# Patient Record
Sex: Female | Born: 2000 | Race: White | Hispanic: No | Marital: Single | Smoking: Never smoker
Health system: Southern US, Community
[De-identification: ages and names within clinical notes are randomized; demographics above are authoritative.]

## PROBLEM LIST (undated history)

## (undated) HISTORY — PX: TYMPANOSTOMY TUBE PLACEMENT: SHX32

---
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
## 2020-11-16 ENCOUNTER — Other Ambulatory Visit: Payer: Self-pay

## 2020-11-16 ENCOUNTER — Encounter: Payer: Self-pay | Admitting: Emergency Medicine

## 2020-11-16 ENCOUNTER — Ambulatory Visit
Admission: EM | Admit: 2020-11-16 | Discharge: 2020-11-16 | Disposition: A | Discharge status: Home / Self Care | Payer: Commercial Managed Care - PPO | Attending: Physician Assistant | Admitting: Physician Assistant

## 2020-11-16 DIAGNOSIS — Z634 Disappearance and death of family member: Secondary | ICD-10-CM | POA: Insufficient documentation

## 2020-11-16 DIAGNOSIS — F4321 Adjustment disorder with depressed mood: Secondary | ICD-10-CM | POA: Insufficient documentation

## 2020-11-16 DIAGNOSIS — R059 Cough, unspecified: Secondary | ICD-10-CM

## 2020-11-16 DIAGNOSIS — R051 Acute cough: Secondary | ICD-10-CM | POA: Insufficient documentation

## 2020-11-16 DIAGNOSIS — Z20822 Contact with and (suspected) exposure to covid-19: Secondary | ICD-10-CM | POA: Diagnosis not present

## 2020-11-16 DIAGNOSIS — R112 Nausea with vomiting, unspecified: Secondary | ICD-10-CM | POA: Diagnosis not present

## 2020-11-16 DIAGNOSIS — N3001 Acute cystitis with hematuria: Secondary | ICD-10-CM | POA: Insufficient documentation

## 2020-11-16 DIAGNOSIS — R630 Anorexia: Secondary | ICD-10-CM | POA: Diagnosis not present

## 2020-11-16 DIAGNOSIS — Z68.41 Body mass index (BMI) pediatric, 5th percentile to less than 85th percentile for age: Secondary | ICD-10-CM | POA: Diagnosis not present

## 2020-11-16 DIAGNOSIS — R0982 Postnasal drip: Secondary | ICD-10-CM | POA: Insufficient documentation

## 2020-11-16 DIAGNOSIS — J069 Acute upper respiratory infection, unspecified: Secondary | ICD-10-CM | POA: Diagnosis not present

## 2020-11-16 LAB — URINALYSIS, COMPLETE (UACMP) WITH MICROSCOPIC
Bilirubin Urine: NEGATIVE
Glucose, UA: NEGATIVE mg/dL
Ketones, ur: NEGATIVE mg/dL
Nitrite: NEGATIVE
Protein, ur: 30 mg/dL — AB
Specific Gravity, Urine: 1.025 (ref 1.005–1.030)
pH: 7 (ref 5.0–8.0)

## 2020-11-16 LAB — RESP PANEL BY RT-PCR (FLU A&B, COVID) ARPGX2
Influenza A by PCR: NEGATIVE
Influenza B by PCR: NEGATIVE
SARS Coronavirus 2 by RT PCR: NEGATIVE

## 2020-11-16 LAB — PREGNANCY, URINE: Preg Test, Ur: NEGATIVE

## 2020-11-16 MED ORDER — PROMETHAZINE HCL 25 MG/ML IJ SOLN: 25.0000 mg | Freq: Once | INTRAMUSCULAR | Status: DC

## 2020-11-16 MED ORDER — ONDANSETRON 4 MG PO TBDP: 4.0000 mg | ORAL_TABLET | Freq: Three times a day (TID) | ORAL | 0 refills | Status: AC | PRN

## 2020-11-16 MED ORDER — PROMETHAZINE-DM 6.25-15 MG/5ML PO SYRP: 5.0000 mL | ORAL_SOLUTION | Freq: Every evening | ORAL | 0 refills | Status: AC | PRN

## 2020-11-16 MED ORDER — SULFAMETHOXAZOLE-TRIMETHOPRIM 800-160 MG PO TABS: 1.0000 | ORAL_TABLET | Freq: Two times a day (BID) | ORAL | 0 refills | Status: AC

## 2020-11-16 NOTE — ED Provider Notes (Signed)
[facility]    CSN: [id number] Arrival date & time: 11/16/20  1613      History   Chief Complaint Chief Complaint  Patient presents with   Cough    HPI Claire Murray is a 19 y.o. female presenting for 1 week history of cough, runny nose/nasal congestion, and post nasal drainage.  Patient states that she was recently [place] in [place] for the death of her brother-in-law.  She states that he passed away about 2 weeks ago.  She states that after he passed away she started to have nausea/vomiting and complete loss of appetite.  She states that anytime she would go to eat something she would vomit.  Patient states that that has only improved over the last 24 hours.  Patient states that the postnasal drainage is increasing her nausea and urge to vomit.  She states she is only vomited once in the past 24 hours.  She has been able to hold down food.  She denies any associate abdominal pain or diarrhea.  She says she did have a couple of episodes of dysuria.  Denies any frequency urgency.  Last menstrual period was about 2 weeks ago.  Patient is sexually active and does use protection with her spouse.  Denies any known COVID-19 exposure, but states she was at a funeral around a lot of people before symptoms started.  She has been taking over-the-counter Mucinex and NyQuil and states that it has helped symptoms a little bit.  She is otherwise healthy without any chronic medical conditions.  No other complaints or concerns.  HPI  History reviewed. No pertinent past medical history.  There are no problems to display for this patient.   Past Surgical History:  Procedure Laterality Date   TYMPANOSTOMY TUBE PLACEMENT      OB History   No obstetric history on file.      Home Medications    Prior to Admission medications   Medication Sig Start Date End Date Taking? Authorizing Provider  ondansetron (ZOFRAN ODT) 4 MG disintegrating tablet Take 1 tablet (4 mg total) by mouth  every 8 (eight) hours as needed for up to 7 days for nausea or vomiting. 11/16/20 11/23/20  Shirlee LatchEaves, Katryna Tschirhart B, PA-C  promethazine-dextromethorphan (PROMETHAZINE-DM) 6.25-15 MG/5ML syrup Take 5 mLs by mouth at bedtime as needed for up to 10 days for cough. 11/16/20 11/26/20  Shirlee LatchEaves, Shaylinn Hladik B, PA-C    Family History Family History  Problem Relation Age of Onset   Healthy Mother    Healthy Father     Social History Social History   Tobacco Use   Smoking status: Never Smoker   Smokeless tobacco: Never Used  Building services engineerVaping Use   Vaping Use: Never used  Substance Use Topics   Alcohol use: Not Currently   Drug use: Not Currently     Allergies   Patient has no known allergies.   Review of Systems Review of Systems  Constitutional: Positive for appetite change and fatigue. Negative for chills, diaphoresis and fever.  HENT: Positive for congestion, postnasal drip and rhinorrhea. Negative for ear pain, sinus pressure, sinus pain and sore throat.   Respiratory: Positive for cough. Negative for shortness of breath.   Cardiovascular: Negative for chest pain.  Gastrointestinal: Positive for nausea and vomiting. Negative for abdominal pain.  Genitourinary: Positive for dysuria (3 times). Negative for frequency and urgency.  Musculoskeletal: Negative for arthralgias and myalgias.  Skin: Negative for rash.  Neurological: Negative for weakness and headaches.  Hematological: Negative for adenopathy.  Psychiatric/Behavioral: Negative for sleep disturbance.     Physical Exam Triage Vital Signs ED Triage Vitals  Enc Vitals Group     BP 11/16/20 1700 (!) 132/91     Pulse Rate 11/16/20 1700 84     Resp 11/16/20 1700 18     Temp 11/16/20 1700 98.1 F (36.7 C)     Temp Source 11/16/20 1700 Oral     SpO2 11/16/20 1700 100 %     Weight 11/16/20 1657 153 lb (69.4 kg)     Height 11/16/20 1657 5\' 5"  (1.651 m)     Head Circumference --      Peak Flow --      Pain Score 11/16/20 1657 0      Pain Loc --      Pain Edu? --      Excl. in GC? --    No data found.  Updated Vital Signs BP (!) 132/91 (BP Location: Left Arm)    Pulse 84    Temp 98.1 F (36.7 C) (Oral)    Resp 18    Ht 5\' 5"  (1.651 m)    Wt 153 lb (69.4 kg)    LMP 11/03/2020    SpO2 100%    BMI 25.46 kg/m    Physical Exam Vitals and nursing note reviewed.  Constitutional:      General: She is not in acute distress.    Appearance: Normal appearance. She is not ill-appearing or toxic-appearing.  HENT:     Head: Normocephalic and atraumatic.     Nose: Rhinorrhea (trace clear drainage) present.     Mouth/Throat:     Mouth: Mucous membranes are moist.     Pharynx: Oropharynx is clear. Posterior oropharyngeal erythema (mild with moderate clear PND) present.  Eyes:     General: No scleral icterus.       Right eye: No discharge.        Left eye: No discharge.     Conjunctiva/sclera: Conjunctivae normal.  Cardiovascular:     Rate and Rhythm: Normal rate and regular rhythm.     Heart sounds: Normal heart sounds.  Pulmonary:     Effort: Pulmonary effort is normal. No respiratory distress.     Breath sounds: Normal breath sounds. No wheezing, rhonchi or rales.  Abdominal:     Palpations: Abdomen is soft.     Tenderness: There is no abdominal tenderness. There is no right CVA tenderness, left CVA tenderness or guarding.  Musculoskeletal:     Cervical back: Neck supple.  Skin:    General: Skin is dry.  Neurological:     General: No focal deficit present.     Mental Status: She is alert. Mental status is at baseline.     Motor: No weakness.     Gait: Gait normal.  Psychiatric:        Mood and Affect: Mood normal.        Behavior: Behavior normal.        Thought Content: Thought content normal.      UC Treatments / Results  Labs (all labs ordered are listed, but only abnormal results are displayed) Labs Reviewed  URINE CULTURE - Abnormal; Notable for the following components:      Result Value    Culture >=100,000 COLONIES/mL STAPHYLOCOCCUS SAPROPHYTICUS (*)    Organism ID, Bacteria STAPHYLOCOCCUS SAPROPHYTICUS (*)    All other components within normal limits  URINALYSIS, COMPLETE (UACMP) WITH MICROSCOPIC - Abnormal; Notable for  the following components:   APPearance CLOUDY (*)    Hgb urine dipstick TRACE (*)    Protein, ur 30 (*)    Leukocytes,Ua SMALL (*)    Bacteria, UA FEW (*)    All other components within normal limits  RESP PANEL BY RT-PCR (FLU A&B, COVID) [id number]  PREGNANCY, URINE    EKG   Radiology No results found.  Procedures Procedures (including critical care time)  Medications Ordered in UC Medications - No data to display  Initial Impression / Assessment and Plan / UC Course  I have reviewed the triage vital signs and the nursing notes.  Pertinent labs & imaging results that were available during my care of the patient were reviewed by me and considered in my medical decision making (see chart for details).    19 year old female presenting for a 1 week history of cough, runny nose, postnasal drip and feeling feverish.  She also states she has had a reduced appetite and nausea for couple weeks.  Admits to about 3 episodes of intermittent dysuria.  All vital signs are normal and stable patient is afebrile and in no acute distress.  Respiratory panel all negative.  Pregnancy test negative.  Urinalysis does show trace blood, small leukocytes, and protein.  We will send urine for culture and treat for suspected UTI since she did have symptoms at one point.  Advised increasing rest and fluids.  Advised she likely has a viral common cold as well as a mild UTI.  I have sent Promethazine DM for her to take in the evenings.  Advised her to take Zofran as needed during the day for nausea.  Bactrim sent for UTI.  Advised to follow-up with PCP if she still having issues with her appetite for the next couple weeks or if she develops any abdominal pain or vomiting again.   Advised this could be part of the grief reaction, but if it persists she will need work-up for other conditions.  ED precautions thoroughly reviewed with patient.   Final Clinical Impressions(s) / UC Diagnoses   Final diagnoses:  Acute upper respiratory infection  Cough  Post-nasal drainage  Non-intractable vomiting with nausea, unspecified vomiting type  Grief reaction  Acute cystitis with hematuria     Discharge Instructions     Your flu and Covid tests are negative.  The urinalysis shows that you might have a UTI.  I have sent Bactrim DS for this.  We will send urine for culture and call you with the results if your treatment needs to be changed.  It is possible that the culture might not grow any bacteria and you may be advised to stop the antibiotic.  Increase your rest and fluids.  Take Zofran as needed during the day for nausea and vomiting.  Take the Promethazine DM cough syrup at bedtime as needed.  Continue to lean on your family and friends for support.  Follow-up with PCP as needed.    ED Prescriptions    Medication Sig Dispense Auth. Provider   promethazine-dextromethorphan (PROMETHAZINE-DM) 6.25-15 MG/5ML syrup Take 5 mLs by mouth at bedtime as needed for up to 10 days for cough. 50 mL Eusebio Friendly B, PA-C   ondansetron (ZOFRAN ODT) 4 MG disintegrating tablet Take 1 tablet (4 mg total) by mouth every 8 (eight) hours as needed for up to 7 days for nausea or vomiting. 20 tablet Eusebio Friendly B, PA-C   sulfamethoxazole-trimethoprim (BACTRIM DS) 800-160 MG tablet Take 1 tablet by mouth 2 (  two) times daily for 3 days. 6 tablet Gareth Morgan     PDMP not reviewed this encounter.   Shirlee Latch, PA-C 11/20/20 [phone]

## 2020-11-16 NOTE — Discharge Instructions (Signed)
Your flu and Covid tests are negative.  The urinalysis shows that you might have a UTI.  I have sent Bactrim DS for this.  We will send urine for culture and call you with the results if your treatment needs to be changed.  It is possible that the culture might not grow any bacteria and you may be advised to stop the antibiotic.  Increase your rest and fluids.  Take Zofran as needed during the day for nausea and vomiting.  Take the Promethazine DM cough syrup at bedtime as needed.  Continue to lean on your family and friends for support.  Follow-up with PCP as needed.

## 2020-11-16 NOTE — ED Triage Notes (Signed)
Pt c/o cough, runny nose and post nasal drip, fever. Started about a week ago. She states she has not had an appetite either but feels that may be related to stress and a death in the family.

## 2020-11-19 LAB — URINE CULTURE: Culture: >=100000 — AB

## 2021-02-24 ENCOUNTER — Other Ambulatory Visit: Payer: Self-pay

## 2021-02-24 DIAGNOSIS — N390 Urinary tract infection, site not specified: Secondary | ICD-10-CM | POA: Diagnosis not present

## 2021-02-24 DIAGNOSIS — R1030 Lower abdominal pain, unspecified: Secondary | ICD-10-CM | POA: Diagnosis present

## 2021-02-24 DIAGNOSIS — R112 Nausea with vomiting, unspecified: Secondary | ICD-10-CM | POA: Insufficient documentation

## 2021-02-24 LAB — CBC
HCT: 41.3 % (ref 36.0–46.0)
Hemoglobin: 14.3 g/dL (ref 12.0–15.0)
MCH: 29.9 pg (ref 26.0–34.0)
MCHC: 34.6 g/dL (ref 30.0–36.0)
MCV: 86.4 fL (ref 80.0–100.0)
Platelets: 284 10*3/uL (ref 150–400)
RBC: 4.78 MIL/uL (ref 3.87–5.11)
RDW: 12.2 % (ref 11.5–15.5)
WBC: 16.5 10*3/uL — ABNORMAL HIGH (ref 4.0–10.5)
nRBC: 0 % (ref 0.0–0.2)

## 2021-02-24 LAB — URINALYSIS, COMPLETE (UACMP) WITH MICROSCOPIC
Bacteria, UA: NONE SEEN
Bilirubin Urine: NEGATIVE
Glucose, UA: NEGATIVE mg/dL
Ketones, ur: 80 mg/dL — AB
Nitrite: NEGATIVE
Protein, ur: 30 mg/dL — AB
Specific Gravity, Urine: 1.029 (ref 1.005–1.030)
pH: 5 (ref 5.0–8.0)

## 2021-02-24 LAB — POC URINE PREG, ED: Preg Test, Ur: NEGATIVE

## 2021-02-24 MED ORDER — ONDANSETRON 4 MG PO TBDP: 4.0000 mg | ORAL_TABLET | Freq: Once | ORAL | Status: DC | PRN

## 2021-02-24 NOTE — ED Notes (Signed)
Patient now reporting chest pain and SOB

## 2021-02-24 NOTE — ED Triage Notes (Signed)
Generalized abdominal pain with n/v. Onset 1700 today. Denies fever at home or known sick contact. Denies diarrhea. Reports increased urinary frequency with decreased output and painful urination with voiding. Urinary symptoms onset 1800 today.

## 2021-02-25 ENCOUNTER — Emergency Department
Admission: EM | Admit: 2021-02-25 | Discharge: 2021-02-25 | Disposition: A | Discharge status: Home / Self Care | Payer: Commercial Managed Care - PPO | Attending: Emergency Medicine | Admitting: Emergency Medicine

## 2021-02-25 ENCOUNTER — Emergency Department: Payer: Commercial Managed Care - PPO

## 2021-02-25 DIAGNOSIS — R112 Nausea with vomiting, unspecified: Secondary | ICD-10-CM

## 2021-02-25 DIAGNOSIS — R1030 Lower abdominal pain, unspecified: Secondary | ICD-10-CM

## 2021-02-25 DIAGNOSIS — N390 Urinary tract infection, site not specified: Secondary | ICD-10-CM

## 2021-02-25 LAB — COMPREHENSIVE METABOLIC PANEL
ALT: 70 U/L — ABNORMAL HIGH (ref 0–44)
AST: 35 U/L (ref 15–41)
Albumin: 4.7 g/dL (ref 3.5–5.0)
Alkaline Phosphatase: 46 U/L (ref 38–126)
Anion gap: 13 (ref 5–15)
BUN: 14 mg/dL (ref 6–20)
CO2: 16 mmol/L — ABNORMAL LOW (ref 22–32)
Calcium: 9.7 mg/dL (ref 8.9–10.3)
Chloride: 107 mmol/L (ref 98–111)
Creatinine, Ser: 0.76 mg/dL (ref 0.44–1.00)
GFR, Estimated: >60 mL/min (ref >60)
Glucose, Bld: 116 mg/dL — ABNORMAL HIGH (ref 70–99)
Potassium: 4 mmol/L (ref 3.5–5.1)
Sodium: 136 mmol/L (ref 135–145)
Total Bilirubin: 2 mg/dL — ABNORMAL HIGH (ref 0.3–1.2)
Total Protein: 8 g/dL (ref 6.5–8.1)

## 2021-02-25 LAB — LIPASE, BLOOD: Lipase: 25 U/L (ref 11–51)

## 2021-02-25 MED ORDER — SODIUM CHLORIDE 0.9 % IV SOLN
1.0000 g | INTRAVENOUS | Status: AC
  Administered 2021-02-25: 1 g via INTRAVENOUS
  Filled 2021-02-25: qty 10

## 2021-02-25 MED ORDER — CEPHALEXIN 250 MG PO CAPS: 250.0000 mg | ORAL_CAPSULE | Freq: Four times a day (QID) | ORAL | 0 refills | Status: AC

## 2021-02-25 MED ORDER — ONDANSETRON 4 MG PO TBDP: ORAL_TABLET | ORAL | 0 refills | Status: DC

## 2021-02-25 MED ORDER — LACTATED RINGERS IV BOLUS
1000.0000 mL | Freq: Once | INTRAVENOUS | Status: AC
  Administered 2021-02-25: 1000 mL via INTRAVENOUS

## 2021-02-25 MED ORDER — ONDANSETRON HCL 4 MG/2ML IJ SOLN
4.0000 mg | INTRAMUSCULAR | Status: AC
  Administered 2021-02-25: 4 mg via INTRAVENOUS
  Filled 2021-02-25: qty 2

## 2021-02-25 MED ORDER — IOHEXOL 300 MG/ML  SOLN
100.0000 mL | Freq: Once | INTRAMUSCULAR | Status: AC | PRN
  Administered 2021-02-25: 100 mL via INTRAVENOUS

## 2021-02-25 NOTE — ED Notes (Signed)
E signature pad not working. Pt educated on discharge instructions and verbalized understanding.  

## 2021-02-25 NOTE — ED Notes (Signed)
Pt states she threw up after drinking some water, but states she feels better and nausea has improved.

## 2021-02-25 NOTE — ED Notes (Signed)
Pt able to drink 12oz of water and hold it down. Dr York Cerise notified.

## 2021-02-25 NOTE — ED Provider Notes (Signed)
[facility] Emergency Department Provider Note  ____________________________________________   Event Date/Time   First MD Initiated Contact with Patient 02/25/21 0021     (approximate)  I have reviewed the triage vital signs and the nursing notes.   HISTORY  Chief Complaint Abdominal Pain    HPI Claire Murray is a 20 y.o. female with no chronic medical issues who presents for evaluation of severe vomiting and lower abdominal pain.  She reports that she started out this morning feeling okay but then around 5 PM she developed nausea and vomiting.  Shortly thereafter she also developed some aching abdominal pain that is essentially constant and her lower abdomen around her bellybutton and just to the left side.  She has also had at least 1 if not 2 days of increased urinary frequency and burning when she urinates.  She has a history of frequent urinary tract infections.  She still has her appendix.  She denies fever, sore throat, chest pain, shortness of breath.  Nothing in particular makes his symptoms better or worse and she is actively vomiting during our interview.  The symptoms are severe.   Her last menstrual period was last week and it was normal.        History reviewed. No pertinent past medical history.  There are no problems to display for this patient.   Past Surgical History:  Procedure Laterality Date  . TYMPANOSTOMY TUBE PLACEMENT      Prior to Admission medications   Medication Sig Start Date End Date Taking? Authorizing Provider  cephALEXin (KEFLEX) 250 MG capsule Take 1 capsule (250 mg total) by mouth 4 (four) times daily for 5 days. 02/25/21 03/02/21 Yes Loleta Rose, MD  ondansetron (ZOFRAN ODT) 4 MG disintegrating tablet Allow 1-2 tablets to dissolve in your mouth every 8 hours as needed for nausea/vomiting 02/25/21  Yes Loleta Rose, MD    Allergies Patient has no known allergies.  Family History  Problem Relation Age of Onset  .  Healthy Mother   . Healthy Father     Social History Social History   Tobacco Use  . Smoking status: Never Smoker  . Smokeless tobacco: Never Used  Vaping Use  . Vaping Use: Never used  Substance Use Topics  . Alcohol use: Not Currently  . Drug use: Not Currently    Review of Systems Constitutional: No fever/chills Eyes: No visual changes. ENT: No sore throat. Cardiovascular: Denies chest pain. Respiratory: Denies shortness of breath. Gastrointestinal: Lower periumbilical abdominal pain slightly worse on the left.  Severe nausea and vomiting for at least 7 hours. Genitourinary: Positive for dysuria and polyuria. Musculoskeletal: Negative for neck pain.  Negative for back pain. Integumentary: Negative for rash. Neurological: Negative for headaches, focal weakness or numbness.   ____________________________________________   PHYSICAL EXAM:  VITAL SIGNS: ED Triage Vitals  Enc Vitals Group     BP 02/24/21 2325 117/73     Pulse Rate 02/24/21 2325 76     Resp 02/24/21 2325 18     Temp 02/24/21 2325 97.8 F (36.6 C)     Temp Source 02/24/21 2325 Oral     SpO2 02/24/21 2325 100 %     Weight 02/24/21 2326 61.2 kg (135 lb)     Height 02/24/21 2326 1.651 m (5\' 5" )     Head Circumference --      Peak Flow --      Pain Score 02/24/21 2326 5     Pain Loc --  Pain Edu? --      Excl. in GC? --     Constitutional: Alert and oriented.  Appears very uncomfortable from nausea. Eyes: Conjunctivae are normal.  Head: Atraumatic. Nose: No congestion/rhinnorhea. Mouth/Throat: Patient is wearing a mask. Neck: No stridor.  No meningeal signs.   Cardiovascular: Normal rate, regular rhythm. Good peripheral circulation. Respiratory: Normal respiratory effort.  No retractions. Gastrointestinal: Soft and nondistended.  Tenderness to palpation around the umbilicus.  No specific focal tenderness but there is some guarding. Musculoskeletal: No lower extremity tenderness nor edema. No  gross deformities of extremities. Neurologic:  Normal speech and language. No gross focal neurologic deficits are appreciated.  Skin:  Skin is warm, dry and intact. Psychiatric: Mood and affect are normal. Speech and behavior are normal.  ____________________________________________   LABS (all labs ordered are listed, but only abnormal results are displayed)  Labs Reviewed  COMPREHENSIVE METABOLIC PANEL - Abnormal; Notable for the following components:      Result Value   CO2 16 (*)    Glucose, Bld 116 (*)    ALT 70 (*)    Total Bilirubin 2.0 (*)    All other components within normal limits  CBC - Abnormal; Notable for the following components:   WBC 16.5 (*)    All other components within normal limits  URINALYSIS, COMPLETE (UACMP) WITH MICROSCOPIC - Abnormal; Notable for the following components:   Color, Urine YELLOW (*)    APPearance HAZY (*)    Hgb urine dipstick SMALL (*)    Ketones, ur 80 (*)    Protein, ur 30 (*)    Leukocytes,Ua MODERATE (*)    All other components within normal limits  URINE CULTURE  LIPASE, BLOOD  POC URINE PREG, ED   ____________________________________________  EKG  None - EKG not ordered by ED physician ____________________________________________  RADIOLOGY Marylou MccoyI, Cory Forbach, personally viewed and evaluated these images (plain radiographs) as part of my medical decision making, as well as reviewing the written report by the radiologist.  ED MD interpretation: Normal CT abdomen/pelvis with no evidence of acute abnormality.  Official radiology report(s): CT ABDOMEN PELVIS W CONTRAST  Result Date: 02/25/2021 CLINICAL DATA:  Nausea and vomiting with right-sided abdominal pain EXAM: CT ABDOMEN AND PELVIS WITH CONTRAST TECHNIQUE: Multidetector CT imaging of the abdomen and pelvis was performed using the standard protocol following bolus administration of intravenous contrast. CONTRAST:  100mL OMNIPAQUE IOHEXOL 300 MG/ML  SOLN COMPARISON:  None.  FINDINGS: Lower chest: No acute abnormality. Hepatobiliary: No focal liver abnormality is seen. No gallstones, gallbladder wall thickening, or biliary dilatation. Pancreas: Unremarkable. No pancreatic ductal dilatation or surrounding inflammatory changes. Spleen: Normal in size without focal abnormality. Adrenals/Urinary Tract: Adrenal glands are within normal limits. Kidneys demonstrate a normal enhancement pattern. No renal calculi or obstructive changes are seen. Stomach/Bowel: No obstructive or inflammatory changes of the colon are noted. The appendix is well visualized and within normal limits. Small bowel and stomach are unremarkable. Vascular/Lymphatic: No significant vascular findings are present. No enlarged abdominal or pelvic lymph nodes. Reproductive: Uterus and bilateral adnexa are unremarkable. Other: No abdominal wall hernia or abnormality. No abdominopelvic ascites. Musculoskeletal: No acute or significant osseous findings. IMPRESSION: Normal-appearing appendix. No acute abnormality noted to correspond with the patient's given clinical symptomatology. Electronically Signed   By: Alcide CleverMark  Lukens M.D.   On: 02/25/2021 01:31    ____________________________________________   PROCEDURES   Procedure(s) performed (including Critical Care):  Procedures   ____________________________________________   INITIAL IMPRESSION / MDM /  ASSESSMENT AND PLAN / ED COURSE  As part of my medical decision making, I reviewed the following data within the electronic MEDICAL RECORD NUMBER Nursing notes reviewed and incorporated, Labs reviewed , Old chart reviewed and Notes from prior ED visits   Differential diagnosis includes, but is not limited to, UTI/pyelonephritis, obstructive stone, gastritis, foodborne pathogen, appendicitis, diverticulitis, mesenteric adenitis, less likely adnexal or uterine issue.  Vital signs are stable and within normal limits with the patient is extremely comfortable due to the  nausea and vomiting.  She said that it is much worse than the pain itself.  However she was guarding when I palpated around her umbilicus.  She has a leukocytosis of 16.5.  Comprehensive metabolic panel is essentially normal other than a very slight elevation of ALT and total bilirubin which I suspect is related to her persistent vomiting.  Urine pregnancy test is negative.  Urinalysis is notable for ketones and moderate leukocytes.  She could be suffering from UTI/pyelonephritis, but this could also be some pyuria in the setting of appendicitis.  Given her tenderness to palpation and constellation of symptoms I will proceed with CT of the abdomen and pelvis with IV contrast.  She is currently not tolerating oral intake.  I also ordered Zofran 4 mg IV and lactated Ringer's 1 L.  If she thinks she needs pain medicine she will let me know.     Clinical Course as of 02/25/21 0175  Wynelle Link Feb 25, 2021  0211 Normal CT abdomen pelvis.  I am treating with ceftriaxone 1 g IV for UTI.  No evidence of pyelonephritis on CT. [CF]  0220 Patient says she feels better.  She understands the results and agrees with the plan for discharge if feeling better and tolerating oral intake. [CF]  0330 Patient passed p.o. challenge and wants to go home.  Prescriptions as listed below.  I gave my usual and customary return precautions and follow-up recommendations. [CF]    Clinical Course User Index [CF] Loleta Rose, MD     ____________________________________________  FINAL CLINICAL IMPRESSION(S) / ED DIAGNOSES  Final diagnoses:  Urinary tract infection without hematuria, site unspecified  Lower abdominal pain  Non-intractable vomiting with nausea, unspecified vomiting type     MEDICATIONS GIVEN DURING THIS VISIT:  Medications  ondansetron (ZOFRAN-ODT) disintegrating tablet 4 mg (has no administration in time range)  ondansetron (ZOFRAN) injection 4 mg (4 mg Intravenous Given 02/25/21 0047)  lactated ringers  bolus 1,000 mL (0 mLs Intravenous Stopped 02/25/21 0233)  iohexol (OMNIPAQUE) 300 MG/ML solution 100 mL (100 mLs Intravenous Contrast Given 02/25/21 0107)  cefTRIAXone (ROCEPHIN) 1 g in sodium chloride 0.9 % 100 mL IVPB (0 g Intravenous Stopped 02/25/21 0233)     ED Discharge Orders         Ordered    cephALEXin (KEFLEX) 250 MG capsule  4 times daily        02/25/21 0328    ondansetron (ZOFRAN ODT) 4 MG disintegrating tablet        02/25/21 1025          *Please note:  Kishana Battey was evaluated in Emergency Department on 02/25/2021 for the symptoms described in the history of present illness. She was evaluated in the context of the global COVID-19 pandemic, which necessitated consideration that the patient might be at risk for infection with the SARS-CoV-2 virus that causes COVID-19. Institutional protocols and algorithms that pertain to the evaluation of patients at risk for COVID-19 are in a state of  rapid change based on information released by regulatory bodies including the CDC and federal and state organizations. These policies and algorithms were followed during the patient's care in the ED.  Some ED evaluations and interventions may be delayed as a result of limited staffing during and after the pandemic.*  Note:  This document was prepared using Dragon voice recognition software and may include unintentional dictation errors.   Loleta Rose, MD 02/25/21 [phone]

## 2021-02-25 NOTE — ED Notes (Signed)
Pt assisted to toilet 

## 2021-02-25 NOTE — Discharge Instructions (Signed)

## 2021-02-26 LAB — URINE CULTURE: Special Requests: NORMAL

## 2021-04-27 ENCOUNTER — Ambulatory Visit: Payer: Self-pay

## 2021-04-28 ENCOUNTER — Encounter: Payer: Self-pay | Admitting: Emergency Medicine

## 2021-04-28 ENCOUNTER — Other Ambulatory Visit: Payer: Self-pay

## 2021-04-28 ENCOUNTER — Ambulatory Visit
Admission: EM | Admit: 2021-04-28 | Discharge: 2021-04-28 | Disposition: A | Discharge status: Home / Self Care | Payer: Commercial Managed Care - PPO | Attending: Sports Medicine | Admitting: Sports Medicine

## 2021-04-28 DIAGNOSIS — R1111 Vomiting without nausea: Secondary | ICD-10-CM | POA: Diagnosis not present

## 2021-04-28 MED ORDER — ONDANSETRON HCL 4 MG PO TABS: 4.0000 mg | ORAL_TABLET | Freq: Four times a day (QID) | ORAL | 0 refills | Status: DC

## 2021-04-28 NOTE — ED Provider Notes (Signed)
[facility]    CSN: [id number] Arrival date & time: 04/28/21  1049      History   Chief Complaint Chief Complaint  Patient presents with  . Emesis    HPI Claire Murray is a 20 y.o. female.   Patient is a 20 year old female who presents for evaluation of the above issue.  She does not have a primary care doctor.  She works in a daycare.  She reports 3 to 4 days of emesis.  She says that it only occurs if she eats solid foods.  She is okay with fluids.  She can also take applesauce.  She has been using over-the-counter antivomiting medication with limited success.  She says several of the children have similar symptoms.  There is concern about it possibly being related to some fruit that was eaten on that day.  It may also be a GI bug.  She denies any fever shakes chills.  No diarrhea.  She has not sought any medical care.  No urinary symptoms.  No blood in the urine or stool.  She has not been working she has been at home resting try to get over this.  No chest pain or shortness of breath.  No COVID exposure or COVID concerns.  No red flag signs or symptoms elicited on history.     History reviewed. No pertinent past medical history.  There are no problems to display for this patient.   Past Surgical History:  Procedure Laterality Date  . TYMPANOSTOMY TUBE PLACEMENT      OB History   No obstetric history on file.      Home Medications    Prior to Admission medications   Medication Sig Start Date End Date Taking? Authorizing Provider  ondansetron (ZOFRAN) 4 MG tablet Take 1 tablet (4 mg total) by mouth every 6 (six) hours. 04/28/21  Yes Delton See, MD    Family History Family History  Problem Relation Age of Onset  . Healthy Mother   . Healthy Father     Social History Social History   Tobacco Use  . Smoking status: Never Smoker  . Smokeless tobacco: Never Used  Vaping Use  . Vaping Use: Never used  Substance Use Topics  . Alcohol use: Not  Currently  . Drug use: Not Currently     Allergies   Patient has no known allergies.   Review of Systems Review of Systems  Constitutional: Positive for activity change and appetite change. Negative for chills, diaphoresis, fatigue and fever.  HENT: Negative for congestion, ear pain, postnasal drip, rhinorrhea, sinus pressure, sinus pain, sneezing and sore throat.   Eyes: Negative for pain.  Respiratory: Negative for cough, chest tightness and shortness of breath.   Cardiovascular: Negative for chest pain and palpitations.  Gastrointestinal: Positive for vomiting. Negative for abdominal pain, blood in stool, diarrhea and nausea.  Genitourinary: Negative for dysuria, flank pain, pelvic pain, vaginal bleeding, vaginal discharge and vaginal pain.  Musculoskeletal: Negative for back pain, myalgias and neck pain.  Skin: Negative for color change, pallor, rash and wound.  Neurological: Negative for dizziness, light-headedness and headaches.  All other systems reviewed and are negative.    Physical Exam Triage Vital Signs ED Triage Vitals  Enc Vitals Group     BP 04/28/21 1101 119/86     Pulse Rate 04/28/21 1101 72     Resp 04/28/21 1101 14     Temp 04/28/21 1101 98.2 F (36.8 C)     Temp  Source 04/28/21 1101 Oral     SpO2 04/28/21 1101 98 %     Weight 04/28/21 1100 122 lb (55.3 kg)     Height 04/28/21 1100 5\' 5"  (1.651 m)     Head Circumference --      Peak Flow --      Pain Score 04/28/21 1100 0     Pain Loc --      Pain Edu? --      Excl. in GC? --    No data found.  Updated Vital Signs BP 119/86 (BP Location: Left Arm)   Pulse 72   Temp 98.2 F (36.8 C) (Oral)   Resp 14   Ht 5\' 5"  (1.651 m)   Wt 55.3 kg   LMP 03/30/2021 (Approximate)   SpO2 98%   BMI 20.30 kg/m   Visual Acuity Right Eye Distance:   Left Eye Distance:   Bilateral Distance:    Right Eye Near:   Left Eye Near:    Bilateral Near:     Physical Exam Vitals and nursing note reviewed.   Constitutional:      General: She is not in acute distress.    Appearance: Normal appearance. She is not ill-appearing, toxic-appearing or diaphoretic.  HENT:     Head: Normocephalic and atraumatic.     Nose: Nose normal.     Mouth/Throat:     Mouth: Mucous membranes are moist.     Pharynx: No oropharyngeal exudate or posterior oropharyngeal erythema.  Eyes:     General: No scleral icterus.       Right eye: No discharge.        Left eye: No discharge.     Conjunctiva/sclera: Conjunctivae normal.     Pupils: Pupils are equal, round, and reactive to light.  Cardiovascular:     Rate and Rhythm: Normal rate and regular rhythm.     Pulses: Normal pulses.     Heart sounds: Normal heart sounds. No murmur heard. No friction rub. No gallop.   Pulmonary:     Effort: Pulmonary effort is normal.     Breath sounds: Normal breath sounds. No stridor. No wheezing, rhonchi or rales.  Abdominal:     General: Abdomen is flat. There is no distension.     Tenderness: There is no abdominal tenderness. There is no right CVA tenderness, left CVA tenderness, guarding or rebound.  Musculoskeletal:     Cervical back: Normal range of motion and neck supple. No rigidity.  Skin:    General: Skin is warm and dry.     Capillary Refill: Capillary refill takes less than 2 seconds.     Findings: No bruising, erythema, lesion or rash.  Neurological:     General: No focal deficit present.     Mental Status: She is alert and oriented to person, place, and time.  Psychiatric:        Mood and Affect: Mood normal.      UC Treatments / Results  Labs (all labs ordered are listed, but only abnormal results are displayed) Labs Reviewed - No data to display  EKG   Radiology No results found.  Procedures Procedures (including critical care time)  Medications Ordered in UC Medications - No data to display  Initial Impression / Assessment and Plan / UC Course  I have reviewed the triage vital signs and  the nursing notes.  Pertinent labs & imaging results that were available during my care of the patient were reviewed by me and  considered in my medical decision making (see chart for details).   Clinical impression: Vomiting without nausea for 3 to 4 days.  Vital signs and examination are reassuring.  No evidence of dehydration.  No acute abdomen.  Treatment plan: 1.  The findings and treatment plan were discussed in detail with the patient.  Patient was in agreement. 2.  I did discuss the fact that her vitals and physical exam are reassuring.  Mucous membranes were moist.  There is no evidence of dehydration. 3.  Educational handouts provided on vomiting and a bland diet. 4.  I gave her prescription for Zofran she get that into her pharmacy. 5.  Provided a work note and she can return to work on her next scheduled shift on Tuesday, May 31. 6.  Indicated that if her symptoms were to worsen in any way she needed to go to the ER for higher level of care. 7.  Put in a referral for her to establish care with a local primary care provider. 8.  She was discharged in stable condition with follow-up as needed.    Final Clinical Impressions(s) / UC Diagnoses   Final diagnoses:  Non-intractable vomiting without nausea, unspecified vomiting type     Discharge Instructions     As we discussed, your vital signs and exam are very reassuring. I have provided educational handouts of vomiting and a bland diet. I also sent in a prescription for Zofran. I also provided you a work note. If your symptoms were to worsen in any way you need to go to the ER for higher level of care. Follow-up here as needed.    ED Prescriptions    Medication Sig Dispense Auth. Provider   ondansetron (ZOFRAN) 4 MG tablet Take 1 tablet (4 mg total) by mouth every 6 (six) hours. 12 tablet Delton See, MD     PDMP not reviewed this encounter.   Delton See, MD 04/28/21 1213

## 2021-04-28 NOTE — ED Triage Notes (Signed)
Patient reports vomiting that started on Wed.  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  Patient denies any pain at this time.

## 2021-04-28 NOTE — Discharge Instructions (Signed)
As we discussed, your vital signs and exam are very reassuring. I have provided educational handouts of vomiting and a bland diet. I also sent in a prescription for Zofran. I also provided you a work note. If your symptoms were to worsen in any way you need to go to the ER for higher level of care. Follow-up here as needed.

## 2021-07-16 ENCOUNTER — Encounter: Payer: Self-pay | Admitting: Emergency Medicine

## 2021-07-16 ENCOUNTER — Ambulatory Visit
Admission: EM | Admit: 2021-07-16 | Discharge: 2021-07-16 | Disposition: A | Discharge status: Home / Self Care | Payer: Commercial Managed Care - PPO | Attending: Physician Assistant | Admitting: Physician Assistant

## 2021-07-16 ENCOUNTER — Other Ambulatory Visit: Payer: Self-pay

## 2021-07-16 DIAGNOSIS — N10 Acute tubulo-interstitial nephritis: Secondary | ICD-10-CM | POA: Diagnosis present

## 2021-07-16 DIAGNOSIS — R109 Unspecified abdominal pain: Secondary | ICD-10-CM | POA: Insufficient documentation

## 2021-07-16 LAB — URINALYSIS, COMPLETE (UACMP) WITH MICROSCOPIC
Glucose, UA: NEGATIVE mg/dL
Ketones, ur: 80 mg/dL — AB
Nitrite: NEGATIVE
Protein, ur: 100 mg/dL — AB
RBC / HPF: >50 RBC/hpf (ref 0–5)
Specific Gravity, Urine: >1.03 — ABNORMAL HIGH (ref 1.005–1.030)
pH: 5.5 (ref 5.0–8.0)

## 2021-07-16 MED ORDER — CIPROFLOXACIN HCL 500 MG PO TABS: 500.0000 mg | ORAL_TABLET | Freq: Two times a day (BID) | ORAL | 0 refills | Status: AC

## 2021-07-16 NOTE — ED Triage Notes (Signed)
Pt c/o left sided flank pain. Started about 5 days ago. 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 Denies urinary symptoms. She states she vomiting 3 days ago but none since.

## 2021-07-16 NOTE — ED Provider Notes (Signed)
[facility]    CSN: [id number] Arrival date & time: 07/16/21  1032      History   Chief Complaint Chief Complaint  Patient presents with   Flank Pain    HPI Claire Murray is a 20 y.o. female.   Patient is a 20 year old female who presents with chief complaint of right sided flank pain.  Patient states her pain began Thursday and actually had right and left flank pain but states the left pain resolved yesterday.  Patient states the pain was worse on Friday and Friday night had episode of vomiting of watery emesis.  She states she tried heating pack which did not help.  She also states last night she woke up with some sweats.  Patient reports some intermittent chills but denies fever or any urinary symptoms.  States last menstrual period was about a month ago and feels like she is probably start her next one today or tomorrow.  Patient reports is normal vaginal discharge.  She does report feeling weak or lightheaded if she stands too long.  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   History reviewed. No pertinent past medical history.  There are no problems to display for this patient.   Past Surgical History:  Procedure Laterality Date   TYMPANOSTOMY TUBE PLACEMENT      OB History   No obstetric history on file.      Home Medications    Prior to Admission medications   Medication Sig Start Date End Date Taking? Authorizing Provider  ciprofloxacin (CIPRO) 500 MG tablet Take 1 tablet (500 mg total) by mouth 2 (two) times daily. 07/16/21  Yes Candis Schatz, PA-C    Family History Family History  Problem Relation Age of Onset   Healthy Mother    Healthy Father     Social History Social History   Tobacco Use   Smoking status: Never   Smokeless tobacco: Never  Vaping Use   Vaping Use: Every day  Substance Use Topics   Alcohol use: Not Currently   Drug use: Not Currently      Allergies   Patient has no known allergies.   Review of Systems Review of Systems as noted above in HPI.  Other systems reviewed and found to be negative   Physical Exam Triage Vital Signs ED Triage Vitals  Enc Vitals Group     BP 07/16/21 1049 108/71     Pulse Rate 07/16/21 1049 91     Resp 07/16/21 1049 18     Temp 07/16/21 1049 98.7 F (37.1 C)     Temp Source 07/16/21 1049 Oral     SpO2 07/16/21 1049 97 %     Weight 07/16/21 1046 121 lb 14.6 oz (55.3 kg)     Height 07/16/21 1046 5\' 5"  (1.651 m)     Head Circumference --      Peak Flow --      Pain Score 07/16/21 1046 7     Pain Loc --      Pain Edu? --      Excl. in GC? --    No data found.  Updated Vital Signs BP 108/71 (BP Location: Left Arm)   Pulse 91   Temp 98.7 F (37.1 C) (Oral)   Resp 18   Ht 5\' 5"  (1.651 m)   Wt 121 lb 14.6 oz (55.3 kg)   LMP 06/27/2021 (  Approximate)   SpO2 97%   BMI 20.29 kg/m   Physical Exam Constitutional:      Appearance: Normal appearance. She is normal weight. She is not ill-appearing.  Cardiovascular:     Rate and Rhythm: Normal rate and regular rhythm.     Heart sounds: Normal heart sounds.  Pulmonary:     Effort: Pulmonary effort is normal.     Breath sounds: Normal breath sounds.  Musculoskeletal:     Comments: Tenderness to the right flank with palpation that is worse with spinal flexion to the left.  No tenderness on the left axillary area or with right flexion.   Neurological:     Mental Status: She is alert.     UC Treatments / Results  Labs (all labs ordered are listed, but only abnormal results are displayed) Labs Reviewed  URINALYSIS, COMPLETE (UACMP) WITH MICROSCOPIC - Abnormal; Notable for the following components:      Result Value   Color, Urine AMBER (*)    APPearance CLOUDY (*)    Specific Gravity, Urine >1.030 (*)    Hgb urine dipstick LARGE (*)    Bilirubin Urine MODERATE (*)    Ketones, ur 80 (*)    Protein, ur 100 (*)     Leukocytes,Ua TRACE (*)    Bacteria, UA MANY (*)    All other components within normal limits  URINE CULTURE    EKG   Radiology No results found.  Procedures Procedures (including critical care time)  Medications Ordered in UC Medications - No data to display  Initial Impression / Assessment and Plan / UC Course  I have reviewed the triage vital signs and the nursing notes.  Pertinent labs & imaging results that were available during my care of the patient were reviewed by me and considered in my medical decision making (see chart for details).    Patient with right flank/back pain that began on Thursday.  Initially had left-sided pain as well but that has resolved.  Patient does work in a daycare facility and is frequently picking up kids and holds them on her hip.  Patient denies any urinary symptoms.  Urinalysis with large blood, trace leukocyte Estrace, many bacteria.  80 ketones.  Patient does report lightheadedness with prolonged standing, this could be related ketones and poor fluid intake.  Patient also reports she is possibly starting her period.  Will send for culture.  We will give her prescription for Cipro for her pyelonephritis.  Will send culture.  Final Clinical Impressions(s) / UC Diagnoses   Final diagnoses:  Right flank pain  Acute pyelonephritis     Discharge Instructions      -Ciprofloxacin: 1 tablet twice a day for 7 days -Ibuprofen or Tylenol as needed for pain -Push fluids.  The lightheadedness with standing could be indicative of not drinking enough water. -Urine culture will be sent, if changes to antibiotic are needed you will be notified. -Avoid any aggravating movements or activities.  There could be muscular fracture as well based on exam and repetitive movements from work. -Follow with his clinic or primary care provider as needed should symptoms worsen or not improve.      ED Prescriptions     Medication Sig Dispense Auth. Provider    ciprofloxacin (CIPRO) 500 MG tablet Take 1 tablet (500 mg total) by mouth 2 (two) times daily. 14 tablet Candis Schatz, PA-C      PDMP not reviewed this encounter.   Candis Schatz, PA-C 07/16/21  1134  

## 2021-07-16 NOTE — Discharge Instructions (Addendum)
-  Ciprofloxacin: 1 tablet twice a day for 7 days -Ibuprofen or Tylenol as needed for pain -Push fluids.  The lightheadedness with standing could be indicative of not drinking enough water. -Urine culture will be sent, if changes to antibiotic are needed you will be notified. -Avoid any aggravating movements or activities.  There could be muscular fracture as well based on exam and repetitive movements from work. -Follow with his clinic or primary care provider as needed should symptoms worsen or not improve.

## 2021-07-17 LAB — URINE CULTURE

## 2022-05-23 IMAGING — CT CT ABD-PELV W/ CM
2 of 5 series · 15 of 46 positions shown, 17 images · IV contrast (APPLIED)
Comparison: None.

CLINICAL DATA: Nausea and vomiting with right-sided abdominal pain

EXAM:
CT ABDOMEN AND PELVIS WITH CONTRAST
TECHNIQUE: Multidetector CT imaging of the abdomen and pelvis was performed
using the standard protocol following bolus administration of
intravenous contrast.
CONTRAST:  100mL OMNIPAQUE IOHEXOL 300 MG/ML  SOLN

[Series 2: routine abd/pel with · axial · 0.76mm/px · z∈[-1087,-747]mm · 12 of 82 slices shown, 14 images]
[im 7/82  soft-tissue]
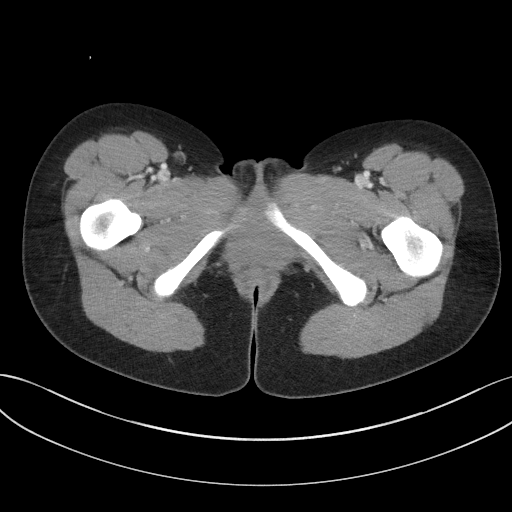
[im 7/82  bone]
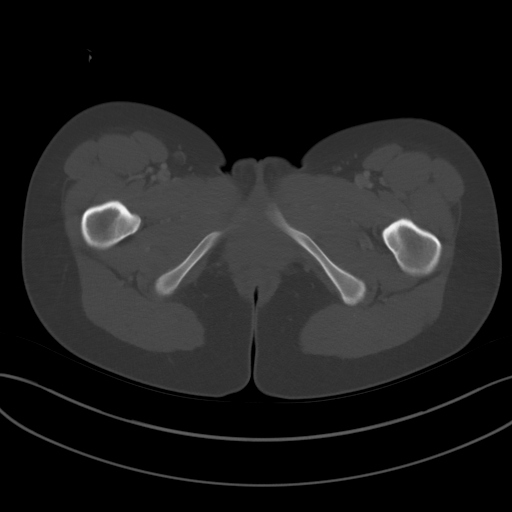
[im 13/82  soft-tissue]
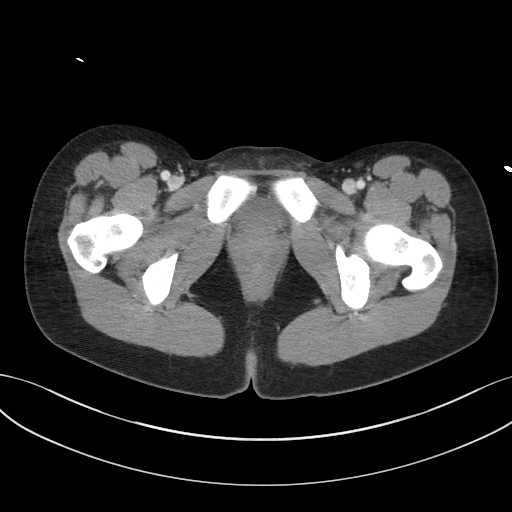
[im 19/82  soft-tissue]
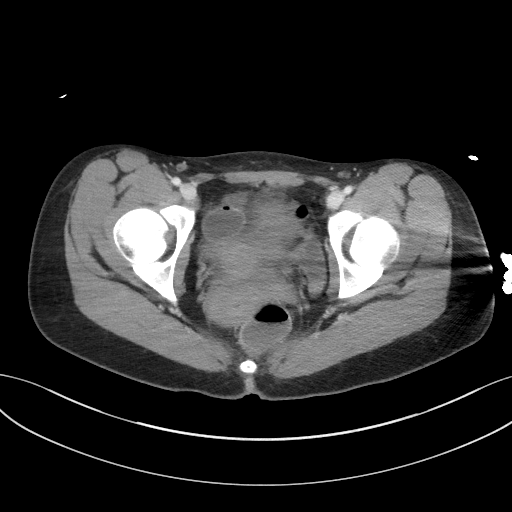
[im 25/82  soft-tissue]
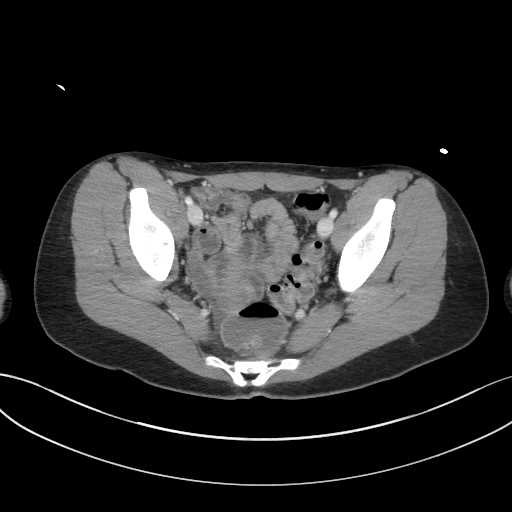
[im 32/82  soft-tissue]
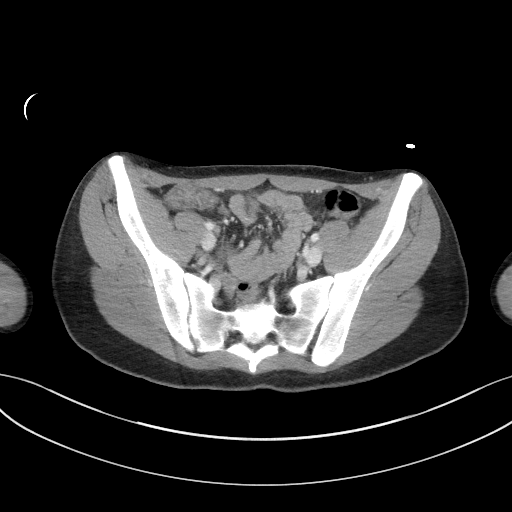
[im 38/82  soft-tissue]
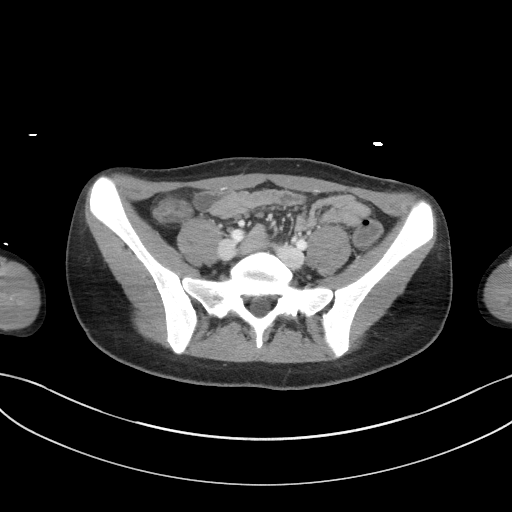
[im 44/82  soft-tissue]
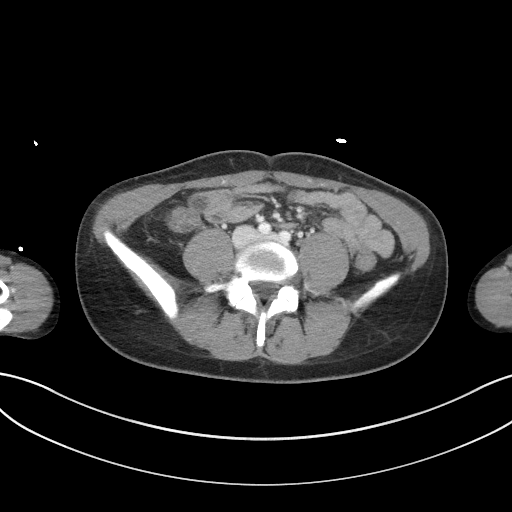
[im 50/82  soft-tissue]
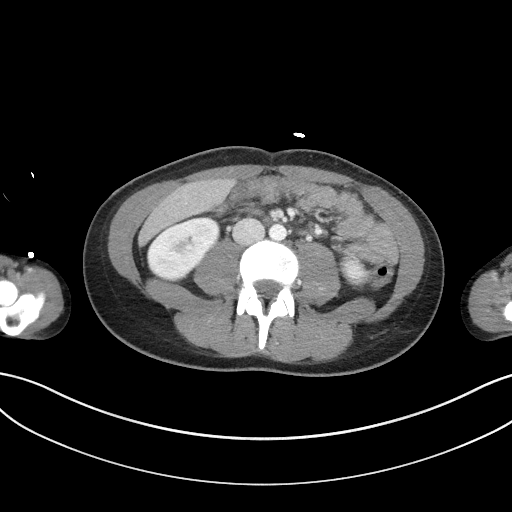
[im 57/82  soft-tissue]
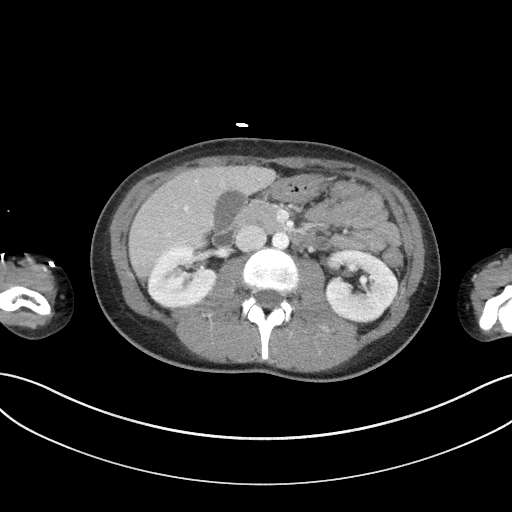
[im 57/82  bone]
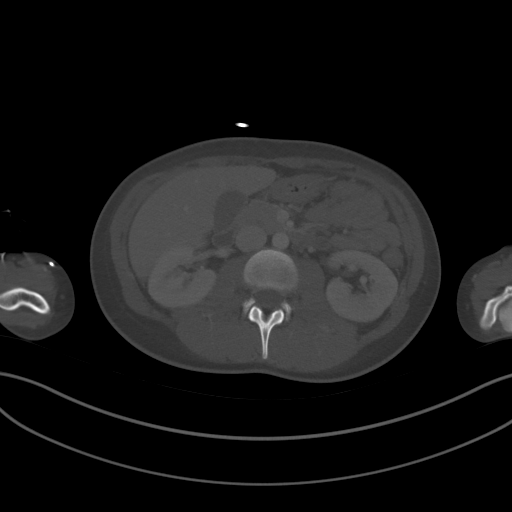
[im 63/82  soft-tissue]
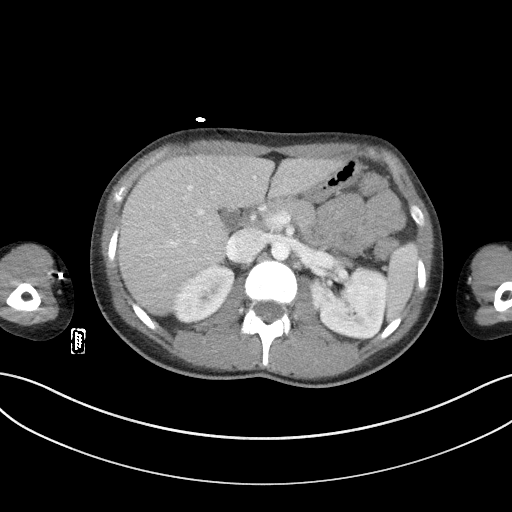
[im 69/82  soft-tissue]
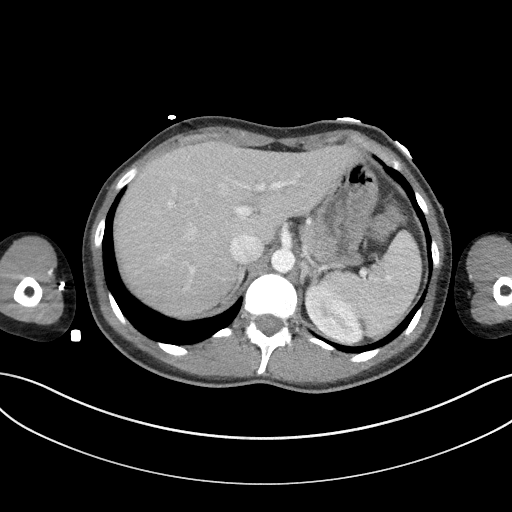
[im 75/82  soft-tissue]
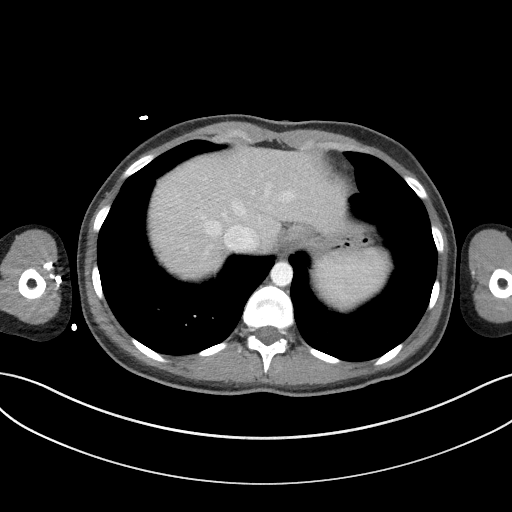

[Series 5: coronal st · coronal · 0.68mm/px · 3 of 81 slices shown]
[im 27/81  soft-tissue]
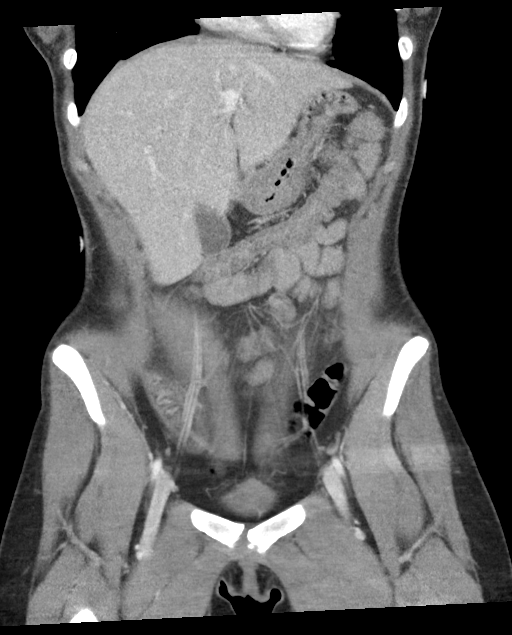
[im 36/81  soft-tissue]
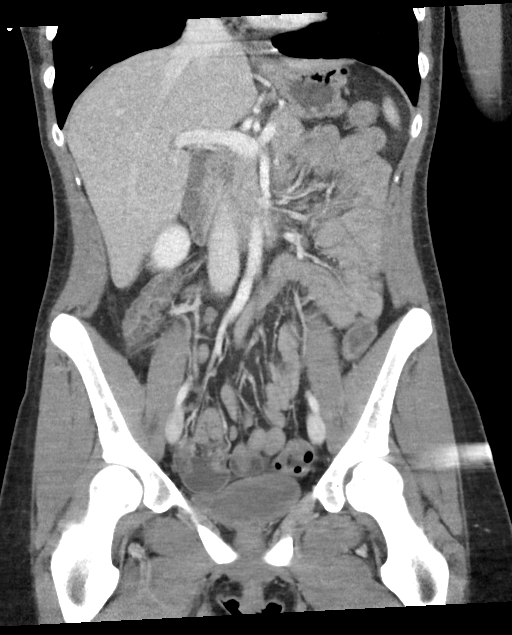
[im 45/81  soft-tissue]
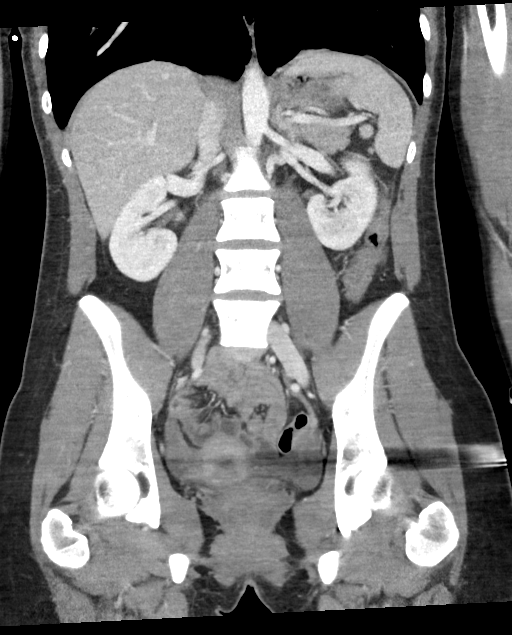

[15 of 46 positions shown; findings below may reference images not displayed]

FINDINGS: Lower chest: No acute abnormality.

Hepatobiliary: No focal liver abnormality is seen. No gallstones,
gallbladder wall thickening, or biliary dilatation.

Pancreas: Unremarkable. No pancreatic ductal dilatation or
surrounding inflammatory changes.

Spleen: Normal in size without focal abnormality.

Adrenals/Urinary Tract: Adrenal glands are within normal limits.
Kidneys demonstrate a normal enhancement pattern. No renal calculi
or obstructive changes are seen.

Stomach/Bowel: No obstructive or inflammatory changes of the colon
are noted. The appendix is well visualized and within normal limits.
Small bowel and stomach are unremarkable.

Vascular/Lymphatic: No significant vascular findings are present. No
enlarged abdominal or pelvic lymph nodes.

Reproductive: Uterus and bilateral adnexa are unremarkable.

Other: No abdominal wall hernia or abnormality. No abdominopelvic
ascites.

Musculoskeletal: No acute or significant osseous findings.
IMPRESSION: Normal-appearing appendix.

No acute abnormality noted to correspond with the patient's given
clinical symptomatology.
